# Patient Record
Sex: Female | Born: 1945 | Race: White | Hispanic: No | Marital: Married | Smoking: Never smoker
Health system: Southern US, Community
[De-identification: ages and names within clinical notes are randomized; demographics above are authoritative.]

## PROBLEM LIST (undated history)

## (undated) DIAGNOSIS — F419 Anxiety disorder, unspecified: Secondary | ICD-10-CM

## (undated) HISTORY — PX: FOOT SURGERY: SHX648

---

## 2001-10-16 ENCOUNTER — Other Ambulatory Visit: Admission: RE | Admit: 2001-10-16 | Discharge: 2001-10-16 | Payer: Self-pay | Admitting: Obstetrics and Gynecology

## 2001-10-25 ENCOUNTER — Ambulatory Visit (HOSPITAL_COMMUNITY): Admission: RE | Admit: 2001-10-25 | Discharge: 2001-10-25 | Payer: Self-pay | Admitting: Obstetrics and Gynecology

## 2001-10-25 ENCOUNTER — Encounter: Payer: Self-pay | Admitting: Obstetrics and Gynecology

## 2002-01-14 ENCOUNTER — Encounter: Payer: Self-pay | Admitting: Obstetrics and Gynecology

## 2002-01-14 ENCOUNTER — Ambulatory Visit (HOSPITAL_COMMUNITY): Admission: RE | Admit: 2002-01-14 | Discharge: 2002-01-14 | Payer: Self-pay | Admitting: Obstetrics and Gynecology

## 2020-10-21 DIAGNOSIS — R1032 Left lower quadrant pain: Secondary | ICD-10-CM | POA: Diagnosis not present

## 2020-11-11 DIAGNOSIS — D225 Melanocytic nevi of trunk: Secondary | ICD-10-CM | POA: Diagnosis not present

## 2020-11-11 DIAGNOSIS — L821 Other seborrheic keratosis: Secondary | ICD-10-CM | POA: Diagnosis not present

## 2020-11-11 DIAGNOSIS — D2271 Melanocytic nevi of right lower limb, including hip: Secondary | ICD-10-CM | POA: Diagnosis not present

## 2020-11-11 DIAGNOSIS — L570 Actinic keratosis: Secondary | ICD-10-CM | POA: Diagnosis not present

## 2020-11-11 DIAGNOSIS — L814 Other melanin hyperpigmentation: Secondary | ICD-10-CM | POA: Diagnosis not present

## 2021-01-03 DIAGNOSIS — L570 Actinic keratosis: Secondary | ICD-10-CM | POA: Diagnosis not present

## 2021-01-03 DIAGNOSIS — L820 Inflamed seborrheic keratosis: Secondary | ICD-10-CM | POA: Diagnosis not present

## 2021-01-25 DIAGNOSIS — Z6820 Body mass index (BMI) 20.0-20.9, adult: Secondary | ICD-10-CM | POA: Diagnosis not present

## 2021-01-25 DIAGNOSIS — Z780 Asymptomatic menopausal state: Secondary | ICD-10-CM | POA: Diagnosis not present

## 2021-01-25 DIAGNOSIS — R0602 Shortness of breath: Secondary | ICD-10-CM | POA: Diagnosis not present

## 2021-01-25 DIAGNOSIS — Z0000 Encounter for general adult medical examination without abnormal findings: Secondary | ICD-10-CM | POA: Diagnosis not present

## 2021-01-25 DIAGNOSIS — Z79899 Other long term (current) drug therapy: Secondary | ICD-10-CM | POA: Diagnosis not present

## 2021-01-25 DIAGNOSIS — E7800 Pure hypercholesterolemia, unspecified: Secondary | ICD-10-CM | POA: Diagnosis not present

## 2021-01-25 DIAGNOSIS — Z1159 Encounter for screening for other viral diseases: Secondary | ICD-10-CM | POA: Diagnosis not present

## 2021-02-07 DIAGNOSIS — Z780 Asymptomatic menopausal state: Secondary | ICD-10-CM | POA: Diagnosis not present

## 2021-02-07 DIAGNOSIS — Z681 Body mass index (BMI) 19 or less, adult: Secondary | ICD-10-CM | POA: Diagnosis not present

## 2021-02-07 DIAGNOSIS — E7800 Pure hypercholesterolemia, unspecified: Secondary | ICD-10-CM | POA: Diagnosis not present

## 2021-02-22 DIAGNOSIS — Z1231 Encounter for screening mammogram for malignant neoplasm of breast: Secondary | ICD-10-CM | POA: Diagnosis not present

## 2021-04-28 DIAGNOSIS — Z961 Presence of intraocular lens: Secondary | ICD-10-CM | POA: Diagnosis not present

## 2021-04-28 DIAGNOSIS — H35033 Hypertensive retinopathy, bilateral: Secondary | ICD-10-CM | POA: Diagnosis not present

## 2021-04-28 DIAGNOSIS — H40013 Open angle with borderline findings, low risk, bilateral: Secondary | ICD-10-CM | POA: Diagnosis not present

## 2021-04-28 DIAGNOSIS — H524 Presbyopia: Secondary | ICD-10-CM | POA: Diagnosis not present

## 2021-04-28 DIAGNOSIS — H52223 Regular astigmatism, bilateral: Secondary | ICD-10-CM | POA: Diagnosis not present

## 2021-05-09 DIAGNOSIS — L820 Inflamed seborrheic keratosis: Secondary | ICD-10-CM | POA: Diagnosis not present

## 2021-05-09 DIAGNOSIS — C44311 Basal cell carcinoma of skin of nose: Secondary | ICD-10-CM | POA: Diagnosis not present

## 2021-05-09 DIAGNOSIS — L821 Other seborrheic keratosis: Secondary | ICD-10-CM | POA: Diagnosis not present

## 2021-05-09 DIAGNOSIS — D2371 Other benign neoplasm of skin of right lower limb, including hip: Secondary | ICD-10-CM | POA: Diagnosis not present

## 2021-05-09 DIAGNOSIS — Z85828 Personal history of other malignant neoplasm of skin: Secondary | ICD-10-CM | POA: Diagnosis not present

## 2021-05-09 DIAGNOSIS — L570 Actinic keratosis: Secondary | ICD-10-CM | POA: Diagnosis not present

## 2021-05-09 DIAGNOSIS — D225 Melanocytic nevi of trunk: Secondary | ICD-10-CM | POA: Diagnosis not present

## 2021-05-09 DIAGNOSIS — L565 Disseminated superficial actinic porokeratosis (DSAP): Secondary | ICD-10-CM | POA: Diagnosis not present

## 2021-05-16 DIAGNOSIS — R42 Dizziness and giddiness: Secondary | ICD-10-CM | POA: Diagnosis not present

## 2021-05-16 DIAGNOSIS — Z79899 Other long term (current) drug therapy: Secondary | ICD-10-CM | POA: Diagnosis not present

## 2021-05-16 DIAGNOSIS — Z1159 Encounter for screening for other viral diseases: Secondary | ICD-10-CM | POA: Diagnosis not present

## 2021-05-16 DIAGNOSIS — Z131 Encounter for screening for diabetes mellitus: Secondary | ICD-10-CM | POA: Diagnosis not present

## 2021-05-16 DIAGNOSIS — Z780 Asymptomatic menopausal state: Secondary | ICD-10-CM | POA: Diagnosis not present

## 2021-05-16 DIAGNOSIS — Z6820 Body mass index (BMI) 20.0-20.9, adult: Secondary | ICD-10-CM | POA: Diagnosis not present

## 2021-05-16 DIAGNOSIS — E7800 Pure hypercholesterolemia, unspecified: Secondary | ICD-10-CM | POA: Diagnosis not present

## 2021-05-24 ENCOUNTER — Emergency Department (HOSPITAL_BASED_OUTPATIENT_CLINIC_OR_DEPARTMENT_OTHER): Payer: Medicare Other

## 2021-05-24 ENCOUNTER — Emergency Department (HOSPITAL_BASED_OUTPATIENT_CLINIC_OR_DEPARTMENT_OTHER)
Admission: EM | Admit: 2021-05-24 | Discharge: 2021-05-24 | Disposition: A | Discharge status: Home / Self Care | Payer: Medicare Other | Attending: Emergency Medicine | Admitting: Emergency Medicine

## 2021-05-24 ENCOUNTER — Encounter (HOSPITAL_BASED_OUTPATIENT_CLINIC_OR_DEPARTMENT_OTHER): Payer: Self-pay | Admitting: *Deleted

## 2021-05-24 ENCOUNTER — Other Ambulatory Visit: Payer: Self-pay

## 2021-05-24 DIAGNOSIS — R109 Unspecified abdominal pain: Secondary | ICD-10-CM | POA: Diagnosis not present

## 2021-05-24 DIAGNOSIS — Z20822 Contact with and (suspected) exposure to covid-19: Secondary | ICD-10-CM | POA: Insufficient documentation

## 2021-05-24 DIAGNOSIS — R197 Diarrhea, unspecified: Secondary | ICD-10-CM | POA: Insufficient documentation

## 2021-05-24 DIAGNOSIS — R1084 Generalized abdominal pain: Secondary | ICD-10-CM | POA: Diagnosis not present

## 2021-05-24 HISTORY — DX: Anxiety disorder, unspecified: F41.9

## 2021-05-24 LAB — URINALYSIS, ROUTINE W REFLEX MICROSCOPIC
Bilirubin Urine: NEGATIVE
Glucose, UA: NEGATIVE mg/dL
Hgb urine dipstick: NEGATIVE
Ketones, ur: 15 mg/dL — AB
Leukocytes,Ua: NEGATIVE
Nitrite: NEGATIVE
Protein, ur: NEGATIVE mg/dL
Specific Gravity, Urine: 1.025 (ref 1.005–1.030)
pH: 5 (ref 5.0–8.0)

## 2021-05-24 LAB — CBC WITH DIFFERENTIAL/PLATELET
Abs Immature Granulocytes: 0.01 10*3/uL (ref 0.00–0.07)
Basophils Absolute: 0 10*3/uL (ref 0.0–0.1)
Basophils Relative: 0 %
Eosinophils Absolute: 0.1 10*3/uL (ref 0.0–0.5)
Eosinophils Relative: 1 %
HCT: 44.5 % (ref 36.0–46.0)
Hemoglobin: 15 g/dL (ref 12.0–15.0)
Immature Granulocytes: 0 %
Lymphocytes Relative: 21 %
Lymphs Abs: 1.2 10*3/uL (ref 0.7–4.0)
MCH: 32.1 pg (ref 26.0–34.0)
MCHC: 33.7 g/dL (ref 30.0–36.0)
MCV: 95.3 fL (ref 80.0–100.0)
Monocytes Absolute: 0.7 10*3/uL (ref 0.1–1.0)
Monocytes Relative: 12 %
Neutro Abs: 3.5 10*3/uL (ref 1.7–7.7)
Neutrophils Relative %: 66 %
Platelets: 172 10*3/uL (ref 150–400)
RBC: 4.67 MIL/uL (ref 3.87–5.11)
RDW: 12.7 % (ref 11.5–15.5)
WBC: 5.5 10*3/uL (ref 4.0–10.5)
nRBC: 0 % (ref 0.0–0.2)

## 2021-05-24 LAB — COMPREHENSIVE METABOLIC PANEL
ALT: 13 U/L (ref 0–44)
AST: 22 U/L (ref 15–41)
Albumin: 4.5 g/dL (ref 3.5–5.0)
Alkaline Phosphatase: 66 U/L (ref 38–126)
Anion gap: 11 (ref 5–15)
BUN: 14 mg/dL (ref 8–23)
CO2: 27 mmol/L (ref 22–32)
Calcium: 9.4 mg/dL (ref 8.9–10.3)
Chloride: 100 mmol/L (ref 98–111)
Creatinine, Ser: 0.73 mg/dL (ref 0.44–1.00)
GFR, Estimated: >60 mL/min (ref >60)
Glucose, Bld: 87 mg/dL (ref 70–99)
Potassium: 3.8 mmol/L (ref 3.5–5.1)
Sodium: 138 mmol/L (ref 135–145)
Total Bilirubin: 0.7 mg/dL (ref 0.3–1.2)
Total Protein: 7.9 g/dL (ref 6.5–8.1)

## 2021-05-24 LAB — LIPASE, BLOOD: Lipase: 31 U/L (ref 11–51)

## 2021-05-24 MED ORDER — IOHEXOL 300 MG/ML  SOLN
75.0000 mL | Freq: Once | INTRAMUSCULAR | Status: AC | PRN
  Administered 2021-05-24: 75 mL via INTRAVENOUS

## 2021-05-24 MED ORDER — ONDANSETRON HCL 4 MG PO TABS: 4.0000 mg | ORAL_TABLET | Freq: Four times a day (QID) | ORAL | 0 refills | Status: AC

## 2021-05-24 MED ORDER — AZITHROMYCIN 500 MG PO TABS: 500.0000 mg | ORAL_TABLET | Freq: Every day | ORAL | 0 refills | Status: AC

## 2021-05-24 MED ORDER — SODIUM CHLORIDE 0.9 % IV BOLUS
1000.0000 mL | Freq: Once | INTRAVENOUS | Status: AC
  Administered 2021-05-24: 1000 mL via INTRAVENOUS

## 2021-05-24 NOTE — Discharge Instructions (Addendum)
Take one '500mg'$  antibiotic pill for three days. Use nausea medication as needed.

## 2021-05-24 NOTE — ED Notes (Signed)
Patient transported to CT 

## 2021-05-24 NOTE — ED Provider Notes (Signed)
I personally evaluated the patient during the encounter and completed a history, physical, procedures, medical decision making to contribute to the overall care of the patient and decision making for the patient briefly, the patient is a 75 y.o. female here with diarrhea, abdominal cramping.  Normal vitals.  No fever..  Symptoms of the last 2 days.  Negative home COVID test.  Overall she appears well.  No major medical problems.  No abdominal surgery history.  Some mild diffuse tenderness on abdominal exam.  Suspect viral process or foodborne illness.  Could be a colitis.  Will check basic labs including CT scan abdomen pelvis.  Please see PA note for further results, evaluation, disposition patient.  This chart was dictated using voice recognition software.  Despite best efforts to proofread,  errors can occur which can change the documentation meaning.    EKG Interpretation None            Lennice Sites, DO 05/24/21 1433

## 2021-05-24 NOTE — ED Triage Notes (Signed)
Abdominal pain and diarrhea x 2 days. Her Covid test was negative.

## 2021-05-24 NOTE — ED Provider Notes (Signed)
[place] EMERGENCY DEPARTMENT Provider Note   CSN: [id number] Arrival date & time: 05/24/21  1407     History Chief Complaint  Patient presents with   Abdominal Pain   Diarrhea    Gina Schmidt is a 75 y.o. female with no reported past medical history who has been experiencing lower abd pain and diarrhea since Sunday. She says that the sx come and go, however she does experience diarrhea every time she uses the restroom. No NV or appetite changes. Denies fevers, chills or urinary changes. No recent travel or changes in diet. No recent abx. No hx of diverticulosis or PUD. Nothing seems to make it worse and she has not taken anything to make it better. Is still tolerating PO intake. Negative covid test this am. No history of recent illness or similar symptoms in the past. Reports no current pain or need for analgesia.   Past Medical History:  Diagnosis Date   Anxiety     There are no problems to display for this patient.   Past Surgical History:  Procedure Laterality Date   FOOT SURGERY       OB History   No obstetric history on file.     No family history on file.  Social History   Tobacco Use   Smoking status: Never   Smokeless tobacco: Never  Vaping Use   Vaping Use: Never used  Substance Use Topics   Alcohol use: Yes   Drug use: Never    Home Medications Prior to Admission medications   Medication Sig Start Date End Date Taking? Authorizing Provider  citalopram (CELEXA) 10 MG tablet Take 10 mg by mouth daily.   Yes [provider]    Allergies    Patient has no known allergies.  Review of Systems   Review of Systems  Constitutional:  Negative for appetite change, chills, fatigue and fever.  HENT:  Positive for sore throat.   Respiratory:  Negative for shortness of breath.   Cardiovascular:  Negative for chest pain and palpitations.  Gastrointestinal:  Positive for abdominal pain and diarrhea. Negative for abdominal distention,  nausea and vomiting.  Genitourinary:  Negative for dysuria, flank pain, hematuria, pelvic pain, urgency and vaginal discharge.  Neurological:  Negative for dizziness, light-headedness, numbness and headaches.  All other systems reviewed and are negative.  Physical Exam Updated Vital Signs BP (!) 154/82 (BP Location: Right Arm)   Pulse 66   Temp 98.2 F (36.8 C) (Oral)   Resp 18   Ht '5\' 2"'$  (1.575 m)   Wt 50.8 kg   SpO2 100%   BMI 20.49 kg/m   Physical Exam Vitals and nursing note reviewed.  Constitutional:      General: She is not in acute distress.    Appearance: Normal appearance. She is well-developed.  HENT:     Head: Normocephalic and atraumatic.     Mouth/Throat:     Mouth: Mucous membranes are moist.  Eyes:     General: No scleral icterus.    Conjunctiva/sclera: Conjunctivae normal.  Cardiovascular:     Rate and Rhythm: Normal rate and regular rhythm.  Pulmonary:     Effort: Pulmonary effort is normal. No respiratory distress.  Abdominal:     General: Abdomen is flat. Bowel sounds are normal. There is no distension.     Palpations: Abdomen is soft.     Tenderness: There is abdominal tenderness in the periumbilical area and left lower quadrant. There is no right CVA tenderness,  left CVA tenderness or guarding.     Hernia: No hernia is present.  Skin:    General: Skin is warm and dry.     Findings: No rash.  Neurological:     Mental Status: She is alert.  Psychiatric:        Mood and Affect: Mood normal.    ED Results / Procedures / Treatments   Labs (all labs ordered are listed, but only abnormal results are displayed) Labs Reviewed  URINALYSIS, ROUTINE W REFLEX MICROSCOPIC - Abnormal; Notable for the following components:      Result Value   Ketones, ur 15 (*)    All other components within normal limits  SARS CORONAVIRUS 2 (TAT 6-24 HRS)  COMPREHENSIVE METABOLIC PANEL  LIPASE, BLOOD  CBC WITH DIFFERENTIAL/PLATELET    EKG None  Radiology CT  ABDOMEN PELVIS W CONTRAST  Result Date: 05/24/2021 CLINICAL DATA:  Abdominal pain and diarrhea for the past 2 days. EXAM: CT ABDOMEN AND PELVIS WITH CONTRAST TECHNIQUE: Multidetector CT imaging of the abdomen and pelvis was performed using the standard protocol following bolus administration of intravenous contrast. CONTRAST:  21m OMNIPAQUE IOHEXOL 300 MG/ML  SOLN COMPARISON:  None. FINDINGS: Lower chest: No acute abnormality. Hepatobiliary: No focal liver abnormality is seen. No gallstones, gallbladder wall thickening, or biliary dilatation. Pancreas: Unremarkable. No pancreatic ductal dilatation or surrounding inflammatory changes. Spleen: Normal in size without focal abnormality. Adrenals/Urinary Tract: Adrenal glands are unremarkable. Small bilateral renal cysts. No renal calculi or hydronephrosis. The bladder is unremarkable. Stomach/Bowel: Stomach is within normal limits. Mild wall thickening and mucosal enhancement of multiple small bowel loops in the pelvis. No obstruction. Mobile cecum. Diminutive or absent appendix. Vascular/Lymphatic: Aortic atherosclerosis. No enlarged abdominal or pelvic lymph nodes. Reproductive: The uterus and right ovary unremarkable. 5.7 cm left ovarian cyst with a thin internal septation. Other: Trace free fluid in the pelvis, likely reactive. No pneumoperitoneum. Musculoskeletal: No acute or significant osseous findings. IMPRESSION: 1. Mild wall thickening and mucosal enhancement of multiple small bowel loops in the pelvis, consistent with enteritis. No obstruction. 2. 5.7 cm left ovarian cyst with a thin internal septation. Nonemergent outpatient pelvic ultrasound is recommended for further evaluation. 3. Aortic Atherosclerosis (ICD10-I70.0). Electronically Signed   By: WTitus DubinM.D.   On: 05/24/2021 15:57    Procedures Procedures   Medications Ordered in ED Medications  sodium chloride 0.9 % bolus 1,000 mL (has no administration in time range)    ED Course   I have reviewed the triage vital signs and the nursing notes. Patient seen and discussed with MD Curatolo.   Pertinent labs & imaging results that were available during my care of the patient were reviewed by me and considered in my medical decision making (see chart for details).    MDM Rules/Calculators/A&P Patient is a 75year old female pregnant past medical who presented today with a complaint of lower abdominal pain and diarrhea.  She said that this is going on for 3 days however the pain and diarrhea come and go.  She has had no recent antibiotic any prior abdominal surgery.   The differential diagnosis for generalized abdominal pain includes, but is not limited to AAA, gastroenteritis, appendicitis, Bowel obstruction, Bowel perforation. Gastroparesis, DKA, Hernia, Inflammatory bowel disease, mesenteric ischemia, pancreatitis, peritonitis SBP, volvulus.  All of the above were considered throughout my evaluation of the patient.  Patient with no constitutional symptoms in the ED or at home.  Infection unlikely.  No extreme pain and bowel sounds not  consistent with obstruction.  I am not concerned for ischemic bowel due to patient's minor discomfort level and stable VS.   I ordered routine abdominal pain labs and imaging.  Lab results were within normal limits.  CT abdomen pelvis noted a likely colitis.  Also an incidental right ovarian cyst.  Patient will be treated with a short course of azithromycin for the possibility of bacterial infection.  Also will be sent home with Zofran in the event that she becomes nauseous.  We discussed the importance of a brat diet.  She also reports being unhappy with her current primary care provider and the need of a GYN provider.  Both referrals supplied.  Wife and has been would like to rule out COVID-19 despite their negative home test.  I ordered the test and told them that I would call them with the results.  She reports feeling better after the fluid bolus.   Return precautions discussed.  Agreeable to discharge. Final Clinical Impression(s) / ED Diagnoses Final diagnoses:  Generalized abdominal pain  Diarrhea, unspecified type    Rx / DC Orders Results and diagnoses were explained to the patient. Return precautions discussed in full. Patient had no additional questions and expressed complete understanding.     Rhae Hammock, PA-C 05/24/21 Telford, Adam, DO 05/25/21 0010

## 2021-05-25 ENCOUNTER — Telehealth (HOSPITAL_BASED_OUTPATIENT_CLINIC_OR_DEPARTMENT_OTHER): Payer: Self-pay | Admitting: Emergency Medicine

## 2021-05-25 LAB — SARS CORONAVIRUS 2 (TAT 6-24 HRS): SARS Coronavirus 2: NEGATIVE

## 2021-07-05 ENCOUNTER — Encounter (HOSPITAL_COMMUNITY): Payer: Self-pay | Admitting: Radiology

## 2021-08-01 DIAGNOSIS — R933 Abnormal findings on diagnostic imaging of other parts of digestive tract: Secondary | ICD-10-CM | POA: Diagnosis not present

## 2021-08-01 DIAGNOSIS — K5900 Constipation, unspecified: Secondary | ICD-10-CM | POA: Diagnosis not present

## 2021-08-01 DIAGNOSIS — R143 Flatulence: Secondary | ICD-10-CM | POA: Diagnosis not present

## 2021-08-01 DIAGNOSIS — R197 Diarrhea, unspecified: Secondary | ICD-10-CM | POA: Diagnosis not present

## 2021-08-12 DIAGNOSIS — C44311 Basal cell carcinoma of skin of nose: Secondary | ICD-10-CM | POA: Diagnosis not present

## 2021-08-12 DIAGNOSIS — Z481 Encounter for planned postprocedural wound closure: Secondary | ICD-10-CM | POA: Diagnosis not present

## 2021-08-12 DIAGNOSIS — C4491 Basal cell carcinoma of skin, unspecified: Secondary | ICD-10-CM | POA: Diagnosis not present

## 2021-09-05 DIAGNOSIS — Z6821 Body mass index (BMI) 21.0-21.9, adult: Secondary | ICD-10-CM | POA: Diagnosis not present

## 2021-09-12 DIAGNOSIS — L570 Actinic keratosis: Secondary | ICD-10-CM | POA: Diagnosis not present

## 2021-09-12 DIAGNOSIS — L578 Other skin changes due to chronic exposure to nonionizing radiation: Secondary | ICD-10-CM | POA: Diagnosis not present

## 2021-09-12 DIAGNOSIS — L905 Scar conditions and fibrosis of skin: Secondary | ICD-10-CM | POA: Diagnosis not present

## 2022-10-24 IMAGING — CT CT ABD-PELV W/ CM
2 of 5 series · 16 of 46 positions shown, 18 images · IV contrast (Omnipaque)
Comparison: None.

CLINICAL DATA: Abdominal pain and diarrhea for the past 2 days.

EXAM:
CT ABDOMEN AND PELVIS WITH CONTRAST
TECHNIQUE: Multidetector CT imaging of the abdomen and pelvis was performed
using the standard protocol following bolus administration of
intravenous contrast.
CONTRAST:  75mL OMNIPAQUE IOHEXOL 300 MG/ML  SOLN

[Series 2: axial st · axial · 0.88mm/px · z∈[-432,-82]mm · 13 of 80 slices shown, 15 images]
[im 5/80  soft-tissue]
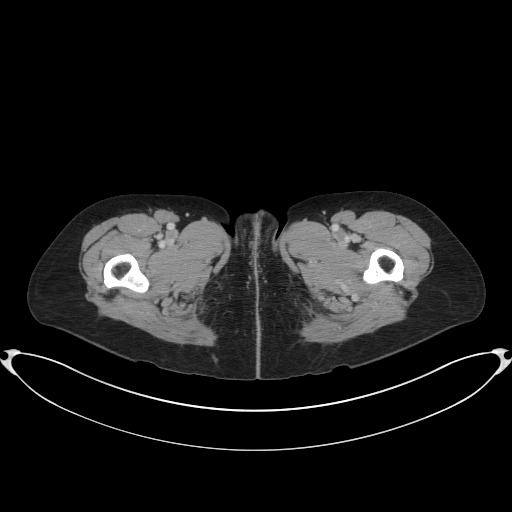
[im 5/80  bone]
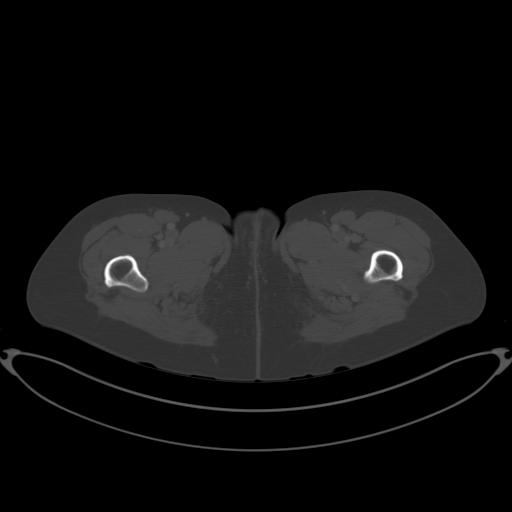
[im 9/80  soft-tissue]
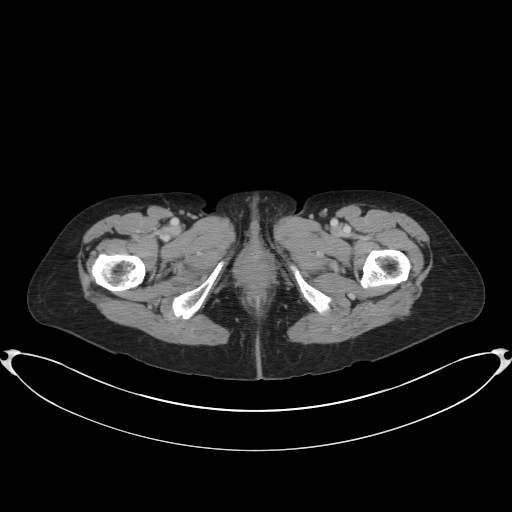
[im 18/80  soft-tissue]
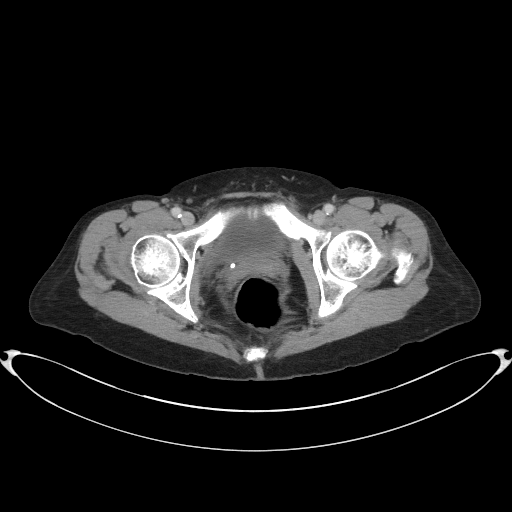
[im 22/80  soft-tissue]
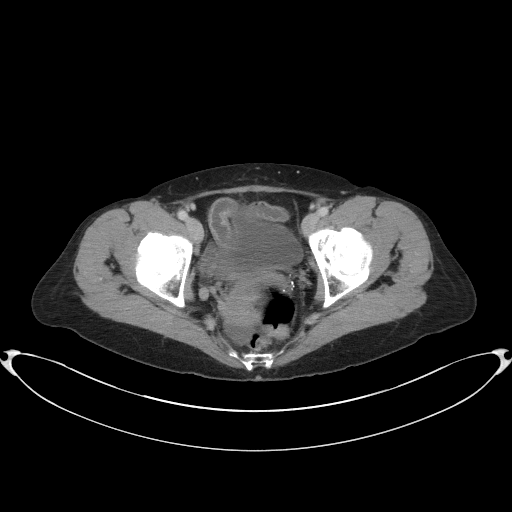
[im 27/80  soft-tissue]
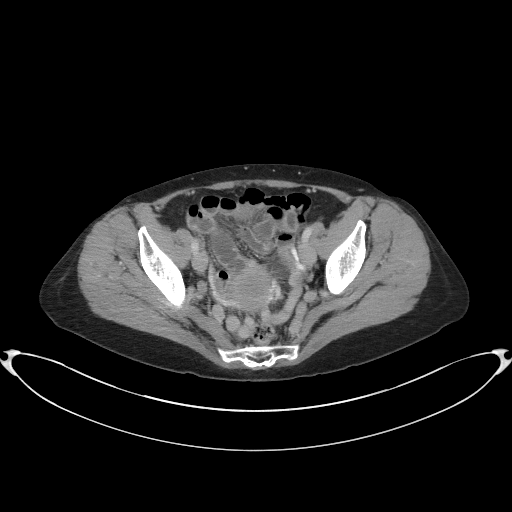
[im 36/80  soft-tissue]
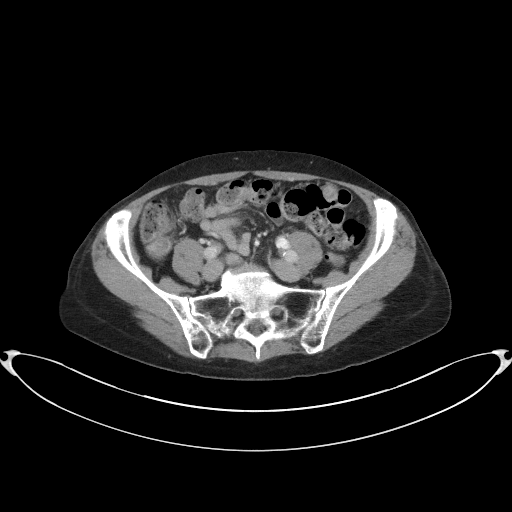
[im 40/80  soft-tissue]
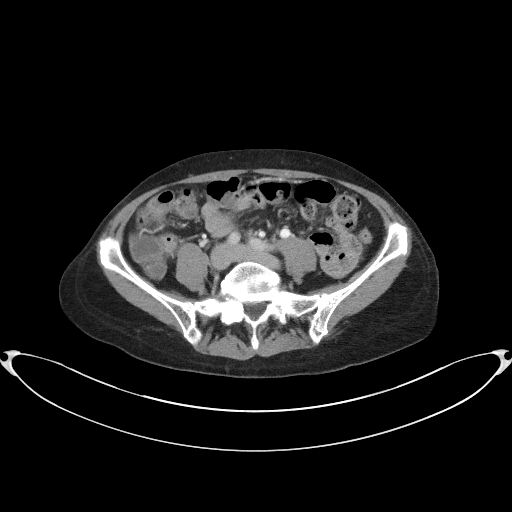
[im 44/80  soft-tissue]
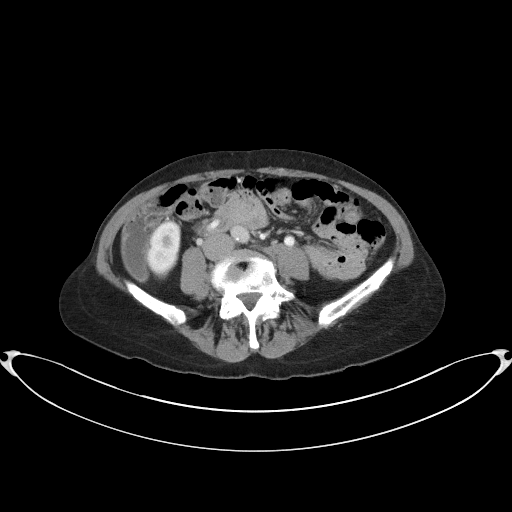
[im 53/80  soft-tissue]
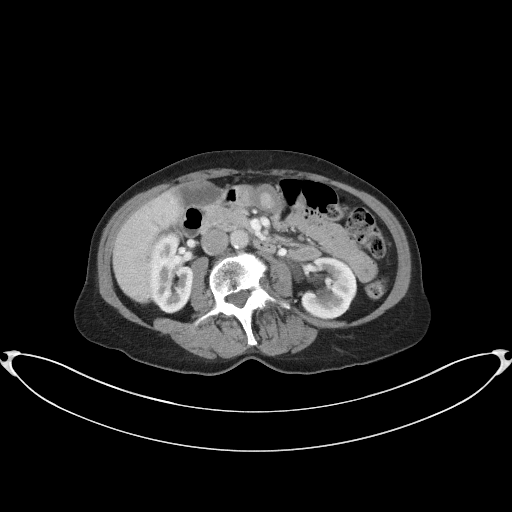
[im 53/80  bone]
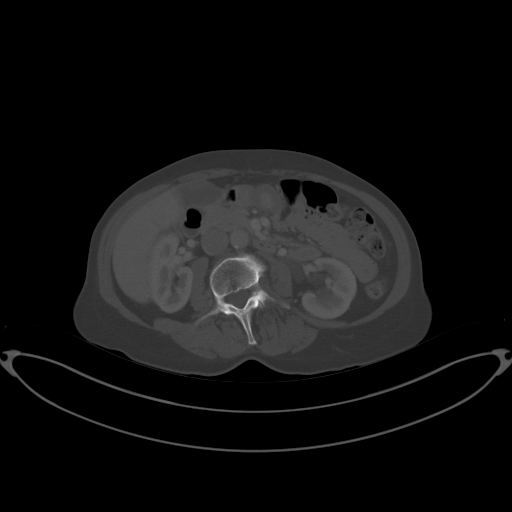
[im 58/80  soft-tissue]
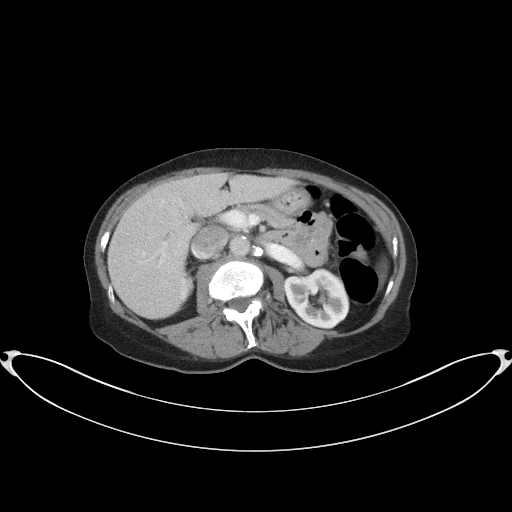
[im 62/80  soft-tissue]
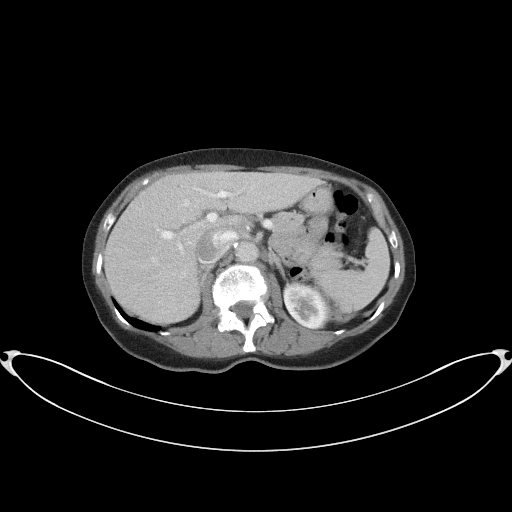
[im 71/80  soft-tissue]
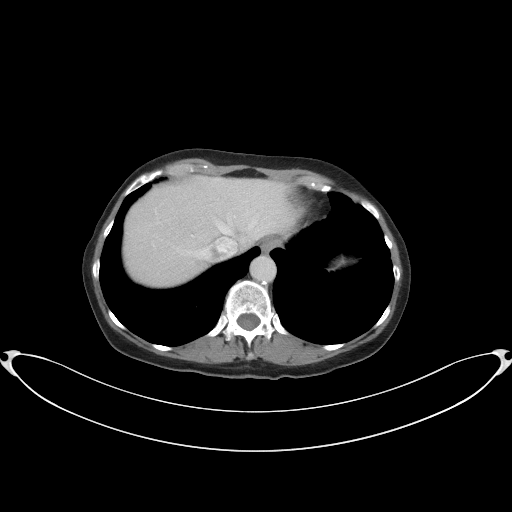
[im 75/80  soft-tissue]
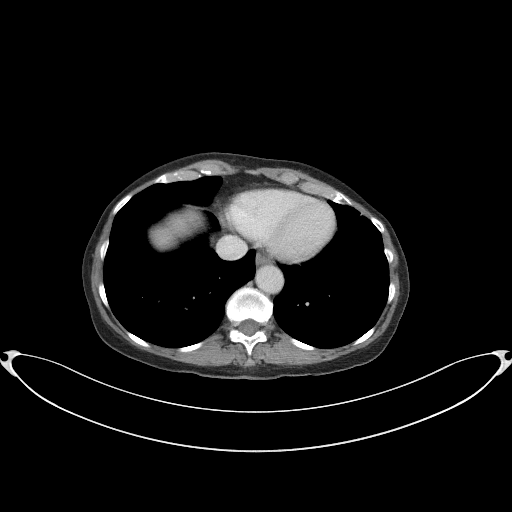

[Series 5: coronal st · coronal · 0.74mm/px · 3 of 69 slices shown]
[im 23/69  soft-tissue]
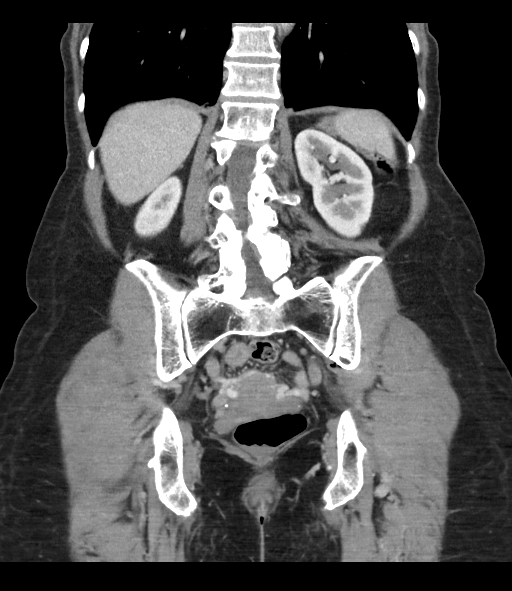
[im 31/69  soft-tissue]
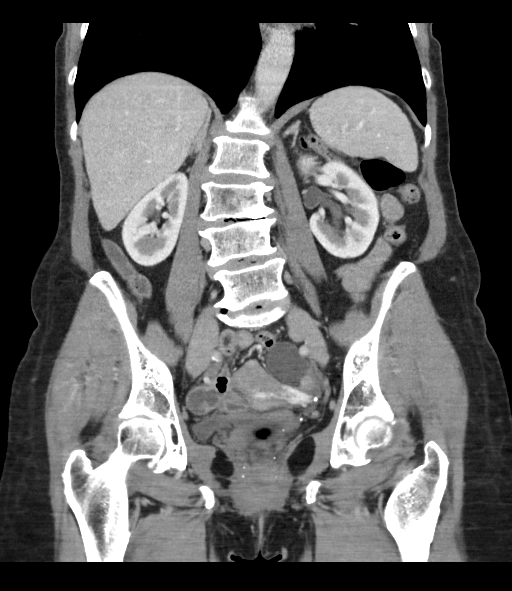
[im 38/69  soft-tissue]
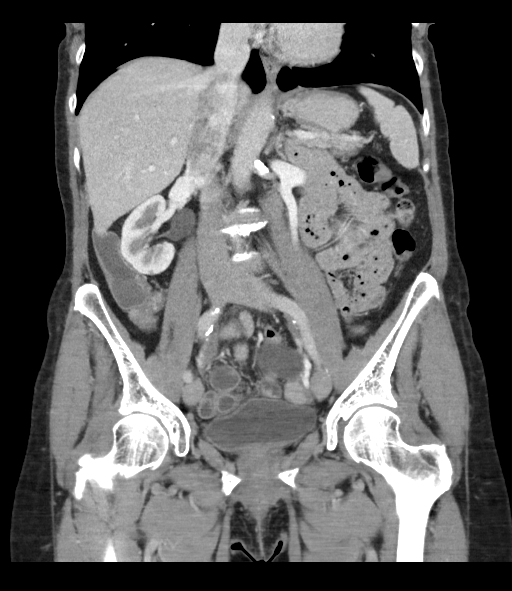

[16 of 46 positions shown; findings below may reference images not displayed]

FINDINGS: Lower chest: No acute abnormality.

Hepatobiliary: No focal liver abnormality is seen. No gallstones,
gallbladder wall thickening, or biliary dilatation.

Pancreas: Unremarkable. No pancreatic ductal dilatation or
surrounding inflammatory changes.

Spleen: Normal in size without focal abnormality.

Adrenals/Urinary Tract: Adrenal glands are unremarkable. Small
bilateral renal cysts. No renal calculi or hydronephrosis. The
bladder is unremarkable.

Stomach/Bowel: Stomach is within normal limits. Mild wall thickening
and mucosal enhancement of multiple small bowel loops in the pelvis.
No obstruction. Mobile cecum. Diminutive or absent appendix.

Vascular/Lymphatic: Aortic atherosclerosis. No enlarged abdominal or
pelvic lymph nodes.

Reproductive: The uterus and right ovary unremarkable. 5.7 cm left
ovarian cyst with a thin internal septation.

Other: Trace free fluid in the pelvis, likely reactive. No
pneumoperitoneum.

Musculoskeletal: No acute or significant osseous findings.
IMPRESSION: 1. Mild wall thickening and mucosal enhancement of multiple small
bowel loops in the pelvis, consistent with enteritis. No
obstruction.
2. 5.7 cm left ovarian cyst with a thin internal septation.
Nonemergent outpatient pelvic ultrasound is recommended for further
evaluation.
3. Aortic Atherosclerosis ([id number]).

## 2023-08-06 ENCOUNTER — Encounter: Payer: Self-pay | Admitting: Cardiovascular Disease

## 2023-08-06 ENCOUNTER — Ambulatory Visit: Payer: Medicare HMO | Attending: Cardiovascular Disease | Admitting: Cardiovascular Disease

## 2023-08-06 VITALS — BP 134/72 | HR 60 | Ht 62.0 in | Wt 116.0 lb

## 2023-08-06 DIAGNOSIS — E785 Hyperlipidemia, unspecified: Secondary | ICD-10-CM | POA: Insufficient documentation

## 2023-08-06 DIAGNOSIS — I2510 Atherosclerotic heart disease of native coronary artery without angina pectoris: Secondary | ICD-10-CM | POA: Diagnosis not present

## 2023-08-06 DIAGNOSIS — R931 Abnormal findings on diagnostic imaging of heart and coronary circulation: Secondary | ICD-10-CM | POA: Diagnosis not present

## 2023-08-06 DIAGNOSIS — Z8249 Family history of ischemic heart disease and other diseases of the circulatory system: Secondary | ICD-10-CM | POA: Diagnosis not present

## 2023-08-06 DIAGNOSIS — E782 Mixed hyperlipidemia: Secondary | ICD-10-CM | POA: Diagnosis not present

## 2023-08-06 NOTE — Assessment & Plan Note (Signed)
History of hyperlipidemia on rosuvastatin 5 mg a day with lipid profile performed 1 year ago revealing a total cholesterol 242, LDL 139 and HDL of 93.  She is not at goal for secondary prevention.  Will we will recheck a lipid liver profile and adjust her medications appropriately.

## 2023-08-06 NOTE — Assessment & Plan Note (Signed)
Father had a myocardial infarction

## 2023-08-06 NOTE — Progress Notes (Signed)
08/06/2023 Gina Schmidt   March 14, 1946  [id number]  Primary Physician Eartha Inch, MD Primary Cardiologist: Runell Gess MD Nicholes Cal[place], [place]  HPI:  Gina Schmidt is a 77 y.o. thin-appearing married Caucasian female mother of 2 children, grandmother of 2 grandchildren who is accompanied by her husband Kathlene November today.  She currently works at the [employer] in [place] but has been in administrative positions, HR and recruiting in the past.  She was self-referred to be established because of an elevated coronary calcium score.  She had previously seen a cardiologist at [facility].  Her cardiac risk factors are notable for hyperlipidemia on low-dose rosuvastatin.  Her father did have a myocardial infarction.  She has never had a heart attack or stroke.  She denies chest pain or shortness of breath.  She walks 2-1/2 miles 3 to 4 days a week without limitation.  She did have a coronary calcium score performed 03/20/2023 which was 591 most of which was in the LAD and RCA.  Subsequent 2D echo and Myoview stress test were normal.     Current Meds  Medication Sig   citalopram (CELEXA) 10 MG tablet Take 10 mg by mouth daily.   rosuvastatin (CRESTOR) 5 MG tablet Take 5 mg by mouth at bedtime.   valACYclovir (VALTREX) 500 MG tablet Take 500 mg by mouth daily.     No Known Allergies  Social History   Socioeconomic History   Marital status: Married    Spouse name: Not on file   Number of children: Not on file   Years of education: Not on file   Highest education level: Not on file  Occupational History   Not on file  Tobacco Use   Smoking status: Never   Smokeless tobacco: Never  Vaping Use   Vaping status: Never Used  Substance and Sexual Activity   Alcohol use: Yes   Drug use: Never   Sexual activity: Not on file  Other Topics Concern   Not on file  Social History Narrative   Not on file   Social Determinants of Health   Financial Resource Strain: Low Risk  (05/01/2023)    Received from [facility]   Overall Financial Resource Strain (CARDIA)    Difficulty of Paying Living Expenses: Not hard at all  Food Insecurity: No Food Insecurity (05/01/2023)   Received from [facility]   Hunger Vital Sign    Worried About Running Out of Food in the Last Year: Never true    Ran Out of Food in the Last Year: Never true  Transportation Needs: No Transportation Needs (05/01/2023)   Received from [facility] - Transportation    Lack of Transportation (Medical): No    Lack of Transportation (Non-Medical): No  Physical Activity: Insufficiently Active (05/01/2023)   Received from [facility]   Exercise Vital Sign    Days of Exercise per Week: 4 days    Minutes of Exercise per Session: 30 min  Stress: No Stress Concern Present (05/01/2023)   Received from [facility] - Occupational Stress Questionnaire    Feeling of Stress : Not at all  Social Connections: Socially Integrated (05/01/2023)   Received from [facility]   Social Network    How would you rate your social network (family, work, friends)?: Good participation with social networks  Intimate Partner Violence: Not At Risk (05/01/2023)   Received from [facility]   HITS  Over the last 12 months how often did your partner physically hurt you?: 1    Over the last 12 months how often did your partner insult you or talk down to you?: 1    Over the last 12 months how often did your partner threaten you with physical harm?: 1    Over the last 12 months how often did your partner scream or curse at you?: 1     Review of Systems: General: negative for chills, fever, night sweats or weight changes.  Cardiovascular: negative for chest pain, dyspnea on exertion, edema, orthopnea, palpitations, paroxysmal nocturnal dyspnea or shortness of breath Dermatological: negative for rash Respiratory: negative for cough or wheezing Urologic: negative for  hematuria Abdominal: negative for nausea, vomiting, diarrhea, bright red blood per rectum, melena, or hematemesis Neurologic: negative for visual changes, syncope, or dizziness All other systems reviewed and are otherwise negative except as noted above.    Blood pressure 134/72, pulse 60, height 5\' 2"  (1.575 m), weight 116 lb (52.6 kg).  General appearance: alert and no distress Neck: no adenopathy, no carotid bruit, no JVD, supple, symmetrical, trachea midline, and thyroid not enlarged, symmetric, no tenderness/mass/nodules Lungs: clear to auscultation bilaterally Heart: regular rate and rhythm, S1, S2 normal, no murmur, click, rub or gallop Extremities: extremities normal, atraumatic, no cyanosis or edema Pulses: 2+ and symmetric Skin: Skin color, texture, turgor normal. No rashes or lesions Neurologic: Grossly normal  EKG EKG Interpretation Date/Time:  Monday August 06 2023 09:34:27 EST Ventricular Rate:  60 PR Interval:  162 QRS Duration:  88 QT Interval:  416 QTC Calculation: 416 R Axis:   74  Text Interpretation: Normal sinus rhythm Normal ECG No previous ECGs available Confirmed by Nanetta Batty [phone]) on 08/06/2023 9:38:27 AM    ASSESSMENT AND PLAN:   Elevated coronary artery calcium score Patient had coronary calcium score performed by her PCP which was 591 performed 03/20/2023.  As result she had up perfusion study which was entirely normal as was a 2D echocardiogram.  She is active and currently asymptomatic.  She is on rosuvastatin 5 mg a day.  Her most recent lipid profile performed 1 year ago revealed total cholesterol 243, LDL of 139 and HDL of 93.  LDL goal less than 70.  Hyperlipidemia History of hyperlipidemia on rosuvastatin 5 mg a day with lipid profile performed 1 year ago revealing a total cholesterol 242, LDL 139 and HDL of 93.  She is not at goal for secondary prevention.  Will we will recheck a lipid liver profile and adjust her medications  appropriately.  Family history of heart disease Father had a myocardial infarction.     Runell Gess MD FACP,FACC,FAHA, [facility] 08/06/2023 10:03 AM

## 2023-08-06 NOTE — Patient Instructions (Addendum)
Lab Work: FASTING LIPID in next 2 weeks. If you have labs (blood work) drawn today and your tests are completely normal, you will receive your results only by: MyChart Message (if you have MyChart) OR A paper copy in the mail If you have any lab test that is abnormal or we need to change your treatment, we will call you to review the results.     Follow-Up: At [facility], you and your health needs are our priority.  As part of our continuing mission to provide you with exceptional heart care, we have created designated Provider Care Teams.  These Care Teams include your primary Cardiologist (physician) and Advanced Practice Providers (APPs -  Physician Assistants and Nurse Practitioners) who all work together to provide you with the care you need, when you need it.  We recommend signing up for the patient portal called "MyChart".  Sign up information is provided on this After Visit Summary.  MyChart is used to connect with patients for Virtual Visits (Telemedicine).  Patients are able to view lab/test results, encounter notes, upcoming appointments, etc.  Non-urgent messages can be sent to your provider as well.   To learn more about what you can do with MyChart, go to [email or web address].    Your next appointment:   12 month(s)  Provider:  Nanetta Batty MD

## 2023-08-06 NOTE — Assessment & Plan Note (Signed)
Patient had coronary calcium score performed by her PCP which was 591 performed 03/20/2023.  As result she had up perfusion study which was entirely normal as was a 2D echocardiogram.  She is active and currently asymptomatic.  She is on rosuvastatin 5 mg a day.  Her most recent lipid profile performed 1 year ago revealed total cholesterol 243, LDL of 139 and HDL of 93.  LDL goal less than 70.

## 2023-08-08 LAB — LIPID PANEL
Chol/HDL Ratio: 2.1 ratio (ref 0.0–4.4)
Cholesterol, Total: 196 mg/dL (ref 100–199)
HDL: 92 mg/dL (ref >39)
LDL Chol Calc (NIH): 93 mg/dL (ref 0–99)
Triglycerides: 57 mg/dL (ref 0–149)
VLDL Cholesterol Cal: 11 mg/dL (ref 5–40)

## 2023-08-09 ENCOUNTER — Other Ambulatory Visit: Payer: Self-pay | Admitting: *Deleted

## 2023-08-09 ENCOUNTER — Telehealth: Payer: Self-pay | Admitting: Cardiovascular Disease

## 2023-08-09 DIAGNOSIS — E782 Mixed hyperlipidemia: Secondary | ICD-10-CM

## 2023-08-09 MED ORDER — ROSUVASTATIN CALCIUM 20 MG PO TABS: 20.0000 mg | ORAL_TABLET | Freq: Every day | ORAL | 3 refills | Status: DC

## 2023-08-09 NOTE — Telephone Encounter (Signed)
Patient called and wanted to know if she can just take 4 tablets of her 5 mg crestor to equal 20 mg until she runs out being then she will pay for the 20 mg tablets.

## 2023-08-09 NOTE — Telephone Encounter (Signed)
Pt c/o medication issue:  1. Name of Medication: rosuvastatin (CRESTOR) 20 MG tablet   2. How are you currently taking this medication (dosage and times per day)?  Take 1 tablet (20 mg total) by mouth at bedtime.       3. Are you having a reaction (difficulty breathing--STAT)? No  4. What is your medication issue? Pt is requesting a callback regarding the medication increase to 20mg . She'd like to see about doing 10mg  instead. Please advise

## 2024-08-19 ENCOUNTER — Telehealth: Payer: Self-pay | Admitting: Cardiovascular Disease

## 2024-08-19 NOTE — Telephone Encounter (Signed)
 Spoke with patient of Dr. Court. She thinks rosuvastatin  is causing dizziness. She has tried 3 statins total. She has not stopped medication. Advised to hold tonight. She has an OV tomorrow with MD and this can be discussed at that time.

## 2024-08-19 NOTE — Telephone Encounter (Signed)
 Pt c/o medication issue:  1. Name of Medication:   rosuvastatin  (CRESTOR ) 20 MG tablet    2. How are you currently taking this medication (dosage and times per day)? As written  3. Are you having a reaction (difficulty breathing--STAT)? no  4. What is your medication issue? Medication makes pt feel dizziness   Please advise

## 2024-08-20 ENCOUNTER — Encounter: Payer: Self-pay | Admitting: Cardiovascular Disease

## 2024-08-20 ENCOUNTER — Ambulatory Visit: Attending: Cardiovascular Disease | Admitting: Cardiovascular Disease

## 2024-08-20 VITALS — BP 134/74 | HR 78 | Ht 62.0 in | Wt 115.6 lb

## 2024-08-20 DIAGNOSIS — R931 Abnormal findings on diagnostic imaging of heart and coronary circulation: Secondary | ICD-10-CM

## 2024-08-20 DIAGNOSIS — E782 Mixed hyperlipidemia: Secondary | ICD-10-CM

## 2024-08-20 MED ORDER — EZETIMIBE 10 MG PO TABS: 10.0000 mg | ORAL_TABLET | Freq: Every day | ORAL | 3 refills | Status: AC

## 2024-08-20 NOTE — Patient Instructions (Signed)
 Medication Instructions:  Your physician has recommended you make the following change in your medication:  -Start taking pravastatin (Pravachol) 20mg  once daily.  *If you need a refill on your cardiac medications before your next appointment, please call your pharmacy*  Lab Work: Your physician recommends that you return for lab work in: 3 months for FASTING lipid/liver panel  If you have labs (blood work) drawn today and your tests are completely normal, you will receive your results only by: MyChart Message (if you have MyChart) OR A paper copy in the mail If you have any lab test that is abnormal or we need to change your treatment, we will call you to review the results.   Follow-Up: At [facility], you and your health needs are our priority.  As part of our continuing mission to provide you with exceptional heart care, our providers are all part of one team.  This team includes your primary Cardiologist (physician) and Advanced Practice Providers or APPs (Physician Assistants and Nurse Practitioners) who all work together to provide you with the care you need, when you need it.  Your next appointment:   12 month(s)  Provider:   Dorn Lesches, MD    We recommend signing up for the patient portal called MyChart.  Sign up information is provided on this After Visit Summary.  MyChart is used to connect with patients for Virtual Visits (Telemedicine).  Patients are able to view lab/test results, encounter notes, upcoming appointments, etc.  Non-urgent messages can be sent to your provider as well.   To learn more about what you can do with MyChart, go to [email or web address].   Other Instructions Heart-Healthy Eating Plan Eating a healthy diet is important for the health of your heart. A heart-healthy eating plan includes: Eating less unhealthy fats. Eating more healthy fats. Eating less salt in your food. Salt is also called sodium. Making other changes in your  diet. Talk with your doctor or a diet specialist (dietitian) to create an eating plan that is right for you. Cooking Avoid frying your food. Try to bake, boil, grill, or broil it instead. You can also reduce fat by: Removing the skin from poultry. Removing all visible fats from meats. Steaming vegetables in water or broth. Meal planning  At meals, divide your plate into four equal parts: Fill one-half of your plate with vegetables and green salads. Fill one-fourth of your plate with whole grains. Fill one-fourth of your plate with lean protein foods. Eat 2-4 cups of vegetables per day. One cup of vegetables is: 1 cup (91 g) broccoli or cauliflower florets. 2 medium carrots. 1 large bell pepper. 1 large sweet potato. 1 large tomato. 1 medium white potato. 2 cups (150 g) raw leafy greens. Eat 1-2 cups of fruit per day. One cup of fruit is: 1 small apple 1 large banana 1 cup (237 g) mixed fruit, 1 large orange,  cup (82 g) dried fruit, 1 cup (240 mL) 100% fruit juice. Eat more foods that have soluble fiber. These are apples, broccoli, carrots, beans, peas, and barley. Try to get 20-30 g of fiber per day. Eat 4-5 servings of nuts, legumes, and seeds per week: 1 serving of dried beans or legumes equals  cup (90 g) cooked. 1 serving of nuts is  oz (12 almonds, 24 pistachios, or 7 walnut halves). 1 serving of seeds equals  oz (8 g). General information Eat more home-cooked food. Eat less restaurant, buffet, and fast food. Limit or avoid alcohol.  Limit foods that are high in starch and sugar. Avoid fried foods. Lose weight if you are overweight. Keep track of how much salt (sodium) you eat. This is important if you have high blood pressure. Ask your doctor to tell you more about this. Try to add vegetarian meals each week. Fats Choose healthy fats. These include olive oil and canola oil, flaxseeds, walnuts, almonds, and seeds. Eat more omega-3 fats. These include salmon,  mackerel, sardines, tuna, flaxseed oil, and ground flaxseeds. Try to eat fish at least 2 times each week. Check food labels. Avoid foods with trans fats or high amounts of saturated fat. Limit saturated fats. These are often found in animal products, such as meats, butter, and cream. These are also found in plant foods, such as palm oil, palm kernel oil, and coconut oil. Avoid foods with partially hydrogenated oils in them. These have trans fats. Examples are stick margarine, some tub margarines, cookies, crackers, and other baked goods. What foods should I eat? Fruits All fresh, canned (in natural juice), or frozen fruits. Vegetables Fresh or frozen vegetables (raw, steamed, roasted, or grilled). Green salads. Grains Most grains. Choose whole wheat and whole grains most of the time. Rice and pasta, including brown rice and pastas made with whole wheat. Meats and other proteins Lean, well-trimmed beef, veal, pork, and lamb. Chicken and turkey without skin. All fish and shellfish. Wild duck, rabbit, pheasant, and venison. Egg whites or low-cholesterol egg substitutes. Dried beans, peas, lentils, and tofu. Seeds and most nuts. Dairy Low-fat or nonfat cheeses, including ricotta and mozzarella. Skim or 1% milk that is liquid, powdered, or evaporated. Buttermilk that is made with low-fat milk. Nonfat or low-fat yogurt. Fats and oils Non-hydrogenated (trans-free) margarines. Vegetable oils, including soybean, sesame, sunflower, olive, peanut, safflower, corn, canola, and cottonseed. Salad dressings or mayonnaise made with a vegetable oil. Beverages Mineral water. Coffee and tea. Diet carbonated beverages. Sweets and desserts Sherbet, gelatin, and fruit ice. Small amounts of dark chocolate. Limit all sweets and desserts. Seasonings and condiments All seasonings and condiments. The items listed above may not be a complete list of foods and drinks you can eat. Contact a dietitian for more  options. What foods should I avoid? Fruits Canned fruit in heavy syrup. Fruit in cream or butter sauce. Fried fruit. Limit coconut. Vegetables Vegetables cooked in cheese, cream, or butter sauce. Fried vegetables. Grains Breads that are made with saturated or trans fats, oils, or whole milk. Croissants. Sweet rolls. Donuts. High-fat crackers, such as cheese crackers. Meats and other proteins Fatty meats, such as hot dogs, ribs, sausage, bacon, rib-eye roast or steak. High-fat deli meats, such as salami and bologna. Caviar. Domestic duck and goose. Organ meats, such as liver. Dairy Cream, sour cream, cream cheese, and creamed cottage cheese. Whole-milk cheeses. Whole or 2% milk that is liquid, evaporated, or condensed. Whole buttermilk. Cream sauce or high-fat cheese sauce. Yogurt that is made from whole milk. Fats and oils Meat fat, or shortening. Cocoa butter, hydrogenated oils, palm oil, coconut oil, palm kernel oil. Solid fats and shortenings, including bacon fat, salt pork, lard, and butter. Nondairy cream substitutes. Salad dressings with cheese or sour cream. Beverages Regular sodas and juice drinks with added sugar. Sweets and desserts Frosting. Pudding. Cookies. Cakes. Pies. Milk chocolate or white chocolate. Buttered syrups. Full-fat ice cream or ice cream drinks. The items listed above may not be a complete list of foods and drinks to avoid. Contact a dietitian for more information. Summary Heart-healthy meal  planning includes eating less unhealthy fats, eating more healthy fats, and making other changes in your diet. Eat a balanced diet. This includes fruits and vegetables, low-fat or nonfat dairy, lean protein, nuts and legumes, whole grains, and heart-healthy oils and fats. This information is not intended to replace advice given to you by your health care provider. Make sure you discuss any questions you have with your health care provider. Document Revised: 10/24/2021 Document  Reviewed: 10/24/2021 Elsevier Patient Education  2024 [employer].

## 2024-08-20 NOTE — Progress Notes (Signed)
 08/20/2024 Cy Prime   May 16, 1946  [id number]  Primary Physician Sophronia Ozell BROCKS, MD Primary Cardiologist: Dorn JINNY Lesches MD [place], [place]  HPI:  Gina Schmidt is a 78 y.o.  thin-appearing married Caucasian female mother of 2 children, grandmother of 2 grandchildren who is accompanied by her husband Garrel today. She currently works at the [employer] in [place] but has been in administrative positions, HR and recruiting in the past. She was self-referred to be established because of an elevated coronary calcium  score.  I last saw her in the office 11//24.  She had previously seen a cardiologist at [facility]. Her cardiac risk factors are notable for hyperlipidemia on low-dose rosuvastatin . Her father did have a myocardial infarction. She has never had a heart attack or stroke. She denies chest pain or shortness of breath. She walks 2-1/2 miles 3 to 4 days a week without limitation. She did have a coronary calcium  score performed 03/20/2023 which was 591 most of which was in the LAD and RCA. Subsequent 2D echo and Myoview stress test were normal.   Since I saw her a year ago she was on Zetia which apparently caused her cholesterol to go up to 234 with an LDL 134.  She otherwise is asymptomatic.  She is about to start pravastatin.  LDL goal less than 70.  Current Meds  Medication Sig   aspirin 81 MG chewable tablet Chew 81 mg by mouth daily.   citalopram (CELEXA) 10 MG tablet Take 10 mg by mouth daily.   ezetimibe (ZETIA) 10 MG tablet Take 10 mg by mouth daily.   valACYclovir (VALTREX) 500 MG tablet Take 500 mg by mouth daily.     No Known Allergies  Social History   Socioeconomic History   Marital status: Married    Spouse name: Not on file   Number of children: Not on file   Years of education: Not on file   Highest education level: Not on file  Occupational History   Not on file  Tobacco Use   Smoking status: Never   Smokeless tobacco: Never  Vaping Use    Vaping status: Never Used  Substance and Sexual Activity   Alcohol use: Yes   Drug use: Never   Sexual activity: Not on file  Other Topics Concern   Not on file  Social History Narrative   Not on file   Social Drivers of Health   Financial Resource Strain: Low Risk  (05/07/2024)   Received from [facility]   Overall Financial Resource Strain (CARDIA)    How hard is it for you to pay for the very basics like food, housing, medical care, and heating?: Not hard at all  Food Insecurity: No Food Insecurity (05/07/2024)   Received from [facility]   Hunger Vital Sign    Within the past 12 months, you worried that your food would run out before you got the money to buy more.: Never true    Within the past 12 months, the food you bought just didn't last and you didn't have money to get more.: Never true  Transportation Needs: No Transportation Needs (05/07/2024)   Received from [facility] - Transportation    In the past 12 months, has lack of transportation kept you from medical appointments or from getting medications?: No    In the past 12 months, has lack of transportation kept you from meetings, work, or from getting things needed for daily living?:  No  Physical Activity: Sufficiently Active (05/07/2024)   Received from [facility]   Exercise Vital Sign    On average, how many days per week do you engage in moderate to strenuous exercise (like a brisk walk)?: 6 days    On average, how many minutes do you engage in exercise at this level?: 40 min  Stress: No Stress Concern Present (05/07/2024)   Received from [facility] - Occupational Stress Questionnaire    Do you feel stress - tense, restless, nervous, or anxious, or unable to sleep at night because your mind is troubled all the time - these days?: Not at all  Social Connections: Socially Integrated (05/07/2024)   Received from [facility]   Social Network    How would you rate  your social network (family, work, friends)?: Good participation with social networks  Intimate Partner Violence: Not At Risk (05/07/2024)   Received from [facility]   HITS    Over the last 12 months how often did your partner physically hurt you?: Never    Over the last 12 months how often did your partner insult you or talk down to you?: Never    Over the last 12 months how often did your partner threaten you with physical harm?: Never    Over the last 12 months how often did your partner scream or curse at you?: Never     Review of Systems: General: negative for chills, fever, night sweats or weight changes.  Cardiovascular: negative for chest pain, dyspnea on exertion, edema, orthopnea, palpitations, paroxysmal nocturnal dyspnea or shortness of breath Dermatological: negative for rash Respiratory: negative for cough or wheezing Urologic: negative for hematuria Abdominal: negative for nausea, vomiting, diarrhea, bright red blood per rectum, melena, or hematemesis Neurologic: negative for visual changes, syncope, or dizziness All other systems reviewed and are otherwise negative except as noted above.    Blood pressure 134/74, pulse 78, height 5' 2 (1.575 m), weight 115 lb 9.6 oz (52.4 kg), SpO2 98%.  General appearance: alert and no distress Neck: no adenopathy, no carotid bruit, no JVD, supple, symmetrical, trachea midline, and thyroid not enlarged, symmetric, no tenderness/mass/nodules Lungs: clear to auscultation bilaterally Heart: regular rate and rhythm, S1, S2 normal, no murmur, click, rub or gallop Extremities: extremities normal, atraumatic, no cyanosis or edema Pulses: 2+ and symmetric Skin: Skin color, texture, turgor normal. No rashes or lesions Neurologic: Grossly normal  EKG EKG Interpretation Date/Time:  Wednesday August 20 2024 13:27:43 EST Ventricular Rate:  72 PR Interval:  140 QRS Duration:  84 QT Interval:  408 QTC Calculation: 446 R  Axis:   72  Text Interpretation: Sinus rhythm with marked sinus arrhythmia When compared with ECG of 06-Aug-2023 09:34, No significant change was found Confirmed by Court Carrier [phone]) on 08/20/2024 1:34:03 PM    ASSESSMENT AND PLAN:   Elevated coronary artery calcium  score Elevated coronary calcium  score of 591 most of which was in the LAD and RCA performed 03/20/2023.  She is asymptomatic.  2D echo and Myoview are normal.  She is not at goal for secondary prevention.  Hyperlipidemia History of hyperlipidemia recently on Zetia which was ineffective and may have caused some side effects.  Her most recent lipid profile performed by her PCP 08/12/2024 revealed an increase in her total cholesterol from 196 a year ago up to 234 with an increase in her LDL from 93-1 34.  Her PCP prescribed pravastatin which she has yet  to start.  We will recheck a lipid liver profile in 3 months to see if this is effective.  If she tolerates it and its effective we will continue.  If she does not tolerate it we will consider PCSK9.     Dorn DOROTHA Lesches MD FACP,FACC,FAHA, [facility] 08/20/2024 1:50 PM

## 2024-08-20 NOTE — Assessment & Plan Note (Signed)
 Elevated coronary calcium  score of 591 most of which was in the LAD and RCA performed 03/20/2023.  She is asymptomatic.  2D echo and Myoview are normal.  She is not at goal for secondary prevention.

## 2024-08-20 NOTE — Assessment & Plan Note (Signed)
 History of hyperlipidemia recently on Zetia  which was ineffective and may have caused some side effects.  Her most recent lipid profile performed by her PCP 08/12/2024 revealed an increase in her total cholesterol from 196 a year ago up to 234 with an increase in her LDL from 93-1 34.  Her PCP prescribed pravastatin which she has yet to start.  We will recheck a lipid liver profile in 3 months to see if this is effective.  If she tolerates it and its effective we will continue.  If she does not tolerate it we will consider PCSK9.
